# Patient Record
Sex: Male | Born: 1980 | Race: White | Hispanic: No | Marital: Single | Smoking: Never smoker
Health system: Southern US, Community
[De-identification: ages and names within clinical notes are randomized; demographics above are authoritative.]

## PROBLEM LIST (undated history)

## (undated) DIAGNOSIS — M549 Dorsalgia, unspecified: Secondary | ICD-10-CM

## (undated) HISTORY — PX: HIP SURGERY: SHX245

---

## 2011-11-15 ENCOUNTER — Emergency Department (HOSPITAL_COMMUNITY)
Admission: EM | Admit: 2011-11-15 | Discharge: 2011-11-15 | Disposition: A | Discharge status: Home / Self Care | Payer: Self-pay | Attending: Emergency Medicine | Admitting: Emergency Medicine

## 2011-11-15 ENCOUNTER — Emergency Department (HOSPITAL_COMMUNITY): Payer: Self-pay

## 2011-11-15 ENCOUNTER — Encounter (HOSPITAL_COMMUNITY): Payer: Self-pay | Admitting: Emergency Medicine

## 2011-11-15 DIAGNOSIS — M25579 Pain in unspecified ankle and joints of unspecified foot: Secondary | ICD-10-CM | POA: Insufficient documentation

## 2011-11-15 MED ORDER — NAPROXEN 500 MG PO TABS: 500.0000 mg | ORAL_TABLET | Freq: Two times a day (BID) | ORAL | Status: AC

## 2011-11-15 NOTE — ED Notes (Signed)
Patient transported to X-ray 

## 2011-11-15 NOTE — ED Notes (Signed)
Pt refused crutches, stated that he had crutches at home

## 2011-11-15 NOTE — ED Notes (Signed)
Pt states he woke up with swelling and redness to right ankle this morning, denies any injury, possible an insect bite, warm to touch and tender to touch, painful to ambulate on it; pt also c/o left lower back pain x 1 week, pt thinks he may have twisted his back

## 2011-11-15 NOTE — ED Notes (Signed)
MD at bedside. 

## 2011-11-15 NOTE — ED Notes (Signed)
Swelling to left ankle and hurting in back. Denies injury.

## 2011-11-15 NOTE — ED Provider Notes (Cosign Needed)
History  This chart was scribed for Benny Lennert, MD by Bennett Scrape. This patient was seen in room APFT22/APFT22 and the patient's care was started at 1:41PM.  CSN: [id number]  Arrival date & time 11/15/11  1324   First MD Initiated Contact with Patient 11/15/11 1341      Chief Complaint  Patient presents with  . Ankle Pain    Patient is a 31 y.o. male presenting with ankle pain. The history is provided by the patient. No language interpreter was used.  Ankle Pain  The incident occurred 6 to 12 hours ago. The incident occurred at home. There was no injury mechanism. The pain is present in the right ankle. The pain has been constant since onset. He reports no foreign bodies present. The symptoms are aggravated by bearing weight. He has tried nothing for the symptoms.    Francisco Kim is a 31 y.o. male who presents to the Emergency Department complaining of gradual onset, non-changing, constant right ankle pain described as throbbing that he woke up with today. He denies having any recent falls, injuries or insect bites and states that the pain is worse with bearing weight. He reports taking benadryl with no improvement. He denies any other injuries or symptoms including fever, sore throat, nausea and back pain as associated symptoms. He has a family h/o gout but denies prior episodes of gout. He does not have a h/o chronic medical conditions. He is an occasional alcohol user but denies smoking.  History reviewed. No pertinent past medical history.  History reviewed. No pertinent past surgical history.  History reviewed. No pertinent family history.  History  Substance Use Topics  . Smoking status: Never Smoker   . Smokeless tobacco: Not on file  . Alcohol Use: Yes     occ   Pt works as a Engineer, petroleum.   Review of Systems  Constitutional: Negative for fatigue.  HENT: Negative for congestion, sinus pressure and ear discharge.   Eyes: Negative for discharge.    Respiratory: Negative for cough.   Cardiovascular: Negative for chest pain.  Gastrointestinal: Negative for abdominal pain and diarrhea.  Genitourinary: Negative for frequency and hematuria.  Musculoskeletal: Negative for back pain.       Right ankle pain  Skin: Negative for rash.  Neurological: Negative for seizures and headaches.  Hematological: Negative.   Psychiatric/Behavioral: Negative for hallucinations.    Allergies  Review of patient's allergies indicates no known allergies.  Home Medications  No current outpatient prescriptions on file.  Triage Vitals: BP 125/86  Pulse 102  Temp 98.4 F (36.9 C) (Oral)  Resp 18  Ht 5\' 6"  (1.676 m)  Wt 135 lb (61.236 kg)  BMI 21.79 kg/m2  SpO2 100%  Physical Exam  Nursing note and vitals reviewed. Constitutional: He is oriented to person, place, and time. He appears well-developed and well-nourished.  HENT:  Head: Normocephalic and atraumatic.  Eyes: Conjunctivae are normal.  Neck: Neck supple. No tracheal deviation present.  Cardiovascular: Normal rate.   Pulmonary/Chest: Effort normal. No respiratory distress.  Musculoskeletal: Normal range of motion. He exhibits edema and tenderness.       Swelling and tenderness to the lateral right ankle  Neurological: He is alert and oriented to person, place, and time.  Skin: Skin is warm and dry.  Psychiatric: He has a normal mood and affect. His behavior is normal.    ED Course  Procedures (including critical care time)  DIAGNOSTIC STUDIES: Oxygen Saturation is 100% on  room air, normal by my interpretation.    COORDINATION OF CARE: 1:48PM-Discussed treatment plan which includes an x-ray of the right ankle with pt at bedside and pt agreed to plan. 2:10PM-All x-rays reviewed with patient and discharge plan discussed which includes pain medication and work note. Pt now admits that the symptoms could be from an insect bite.   Labs Reviewed - No data to display Dg Ankle Complete  Right  11/15/2011  *RADIOLOGY REPORT*  Clinical Data: Right ankle pain.  No known injury.  RIGHT ANKLE - COMPLETE 3+ VIEW  Comparison: None.  Findings: Diffuse soft tissue swelling, most pronounced laterally. No definite effusion.  Unfused ossification center distal to the medial malleolus.  Tiny inferior calcaneal spur.  IMPRESSION: Soft tissue swelling without acute underlying bony abnormality.  Original Report Authenticated By: Darrol Angel, M.D.     No diagnosis found.    MDM        The chart was scribed for me under my direct supervision.  I personally performed the history, physical, and medical decision making and all procedures in the evaluation of this patient.Benny Lennert, MD 11/15/11 1414

## 2013-08-26 IMAGING — CR DG ANKLE COMPLETE 3+V*R*
3 series · 3 of 3 positions shown · non-contrast
Comparison: None.

CLINICAL DATA: Right ankle pain.  No known injury.

RIGHT ANKLE - COMPLETE 3+ VIEW

[view not recorded (1 of 3)]
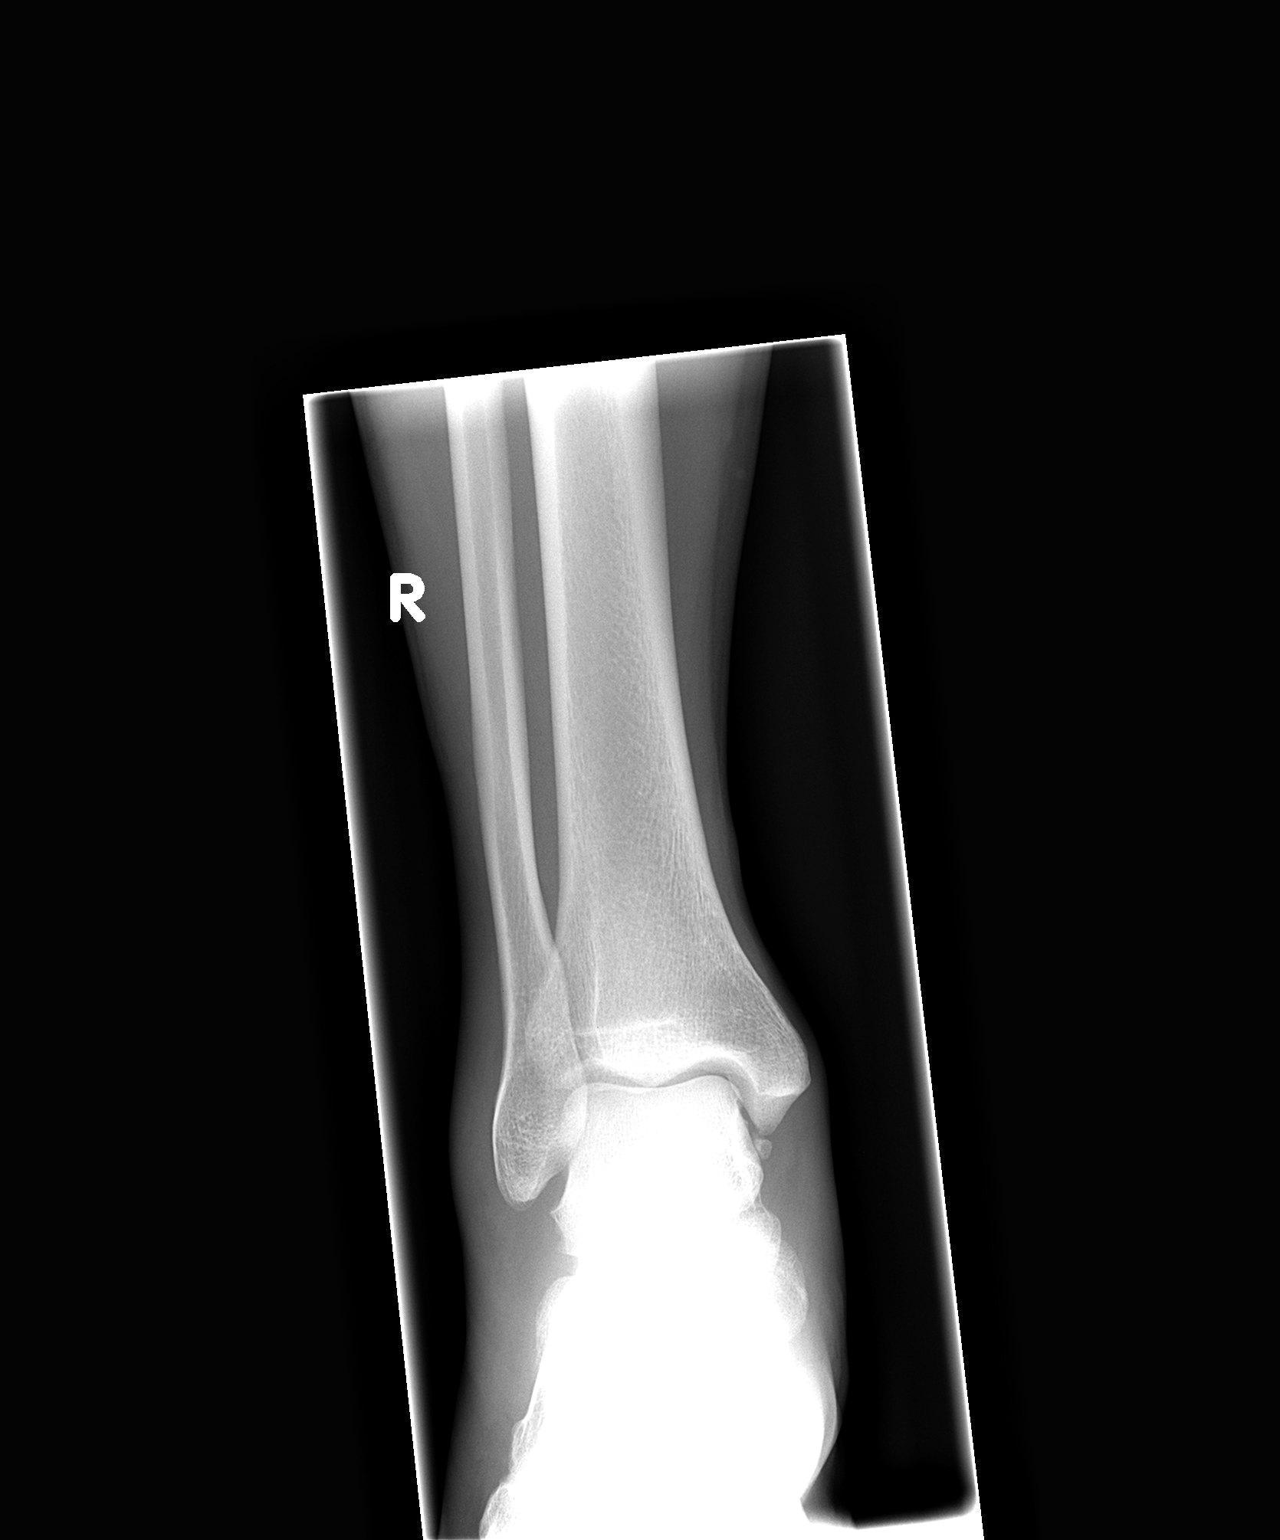

[view not recorded (2 of 3)]
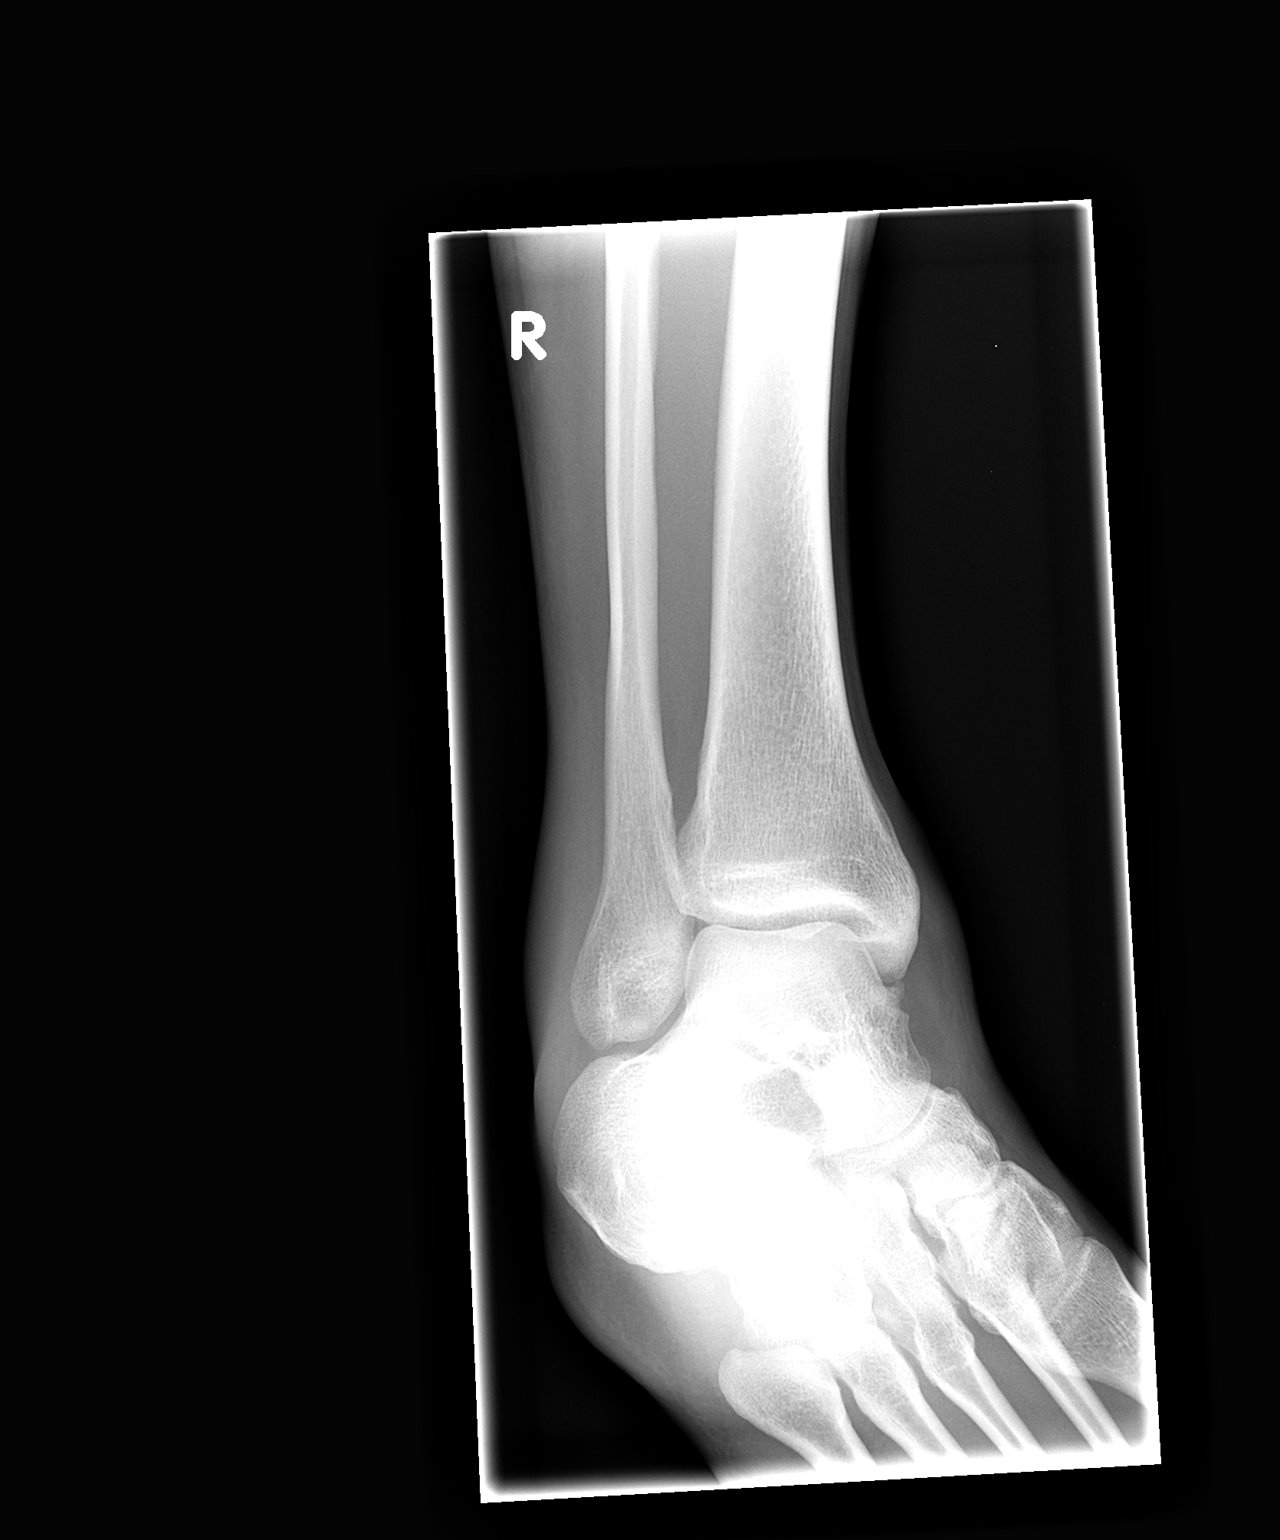

[view not recorded (3 of 3)]
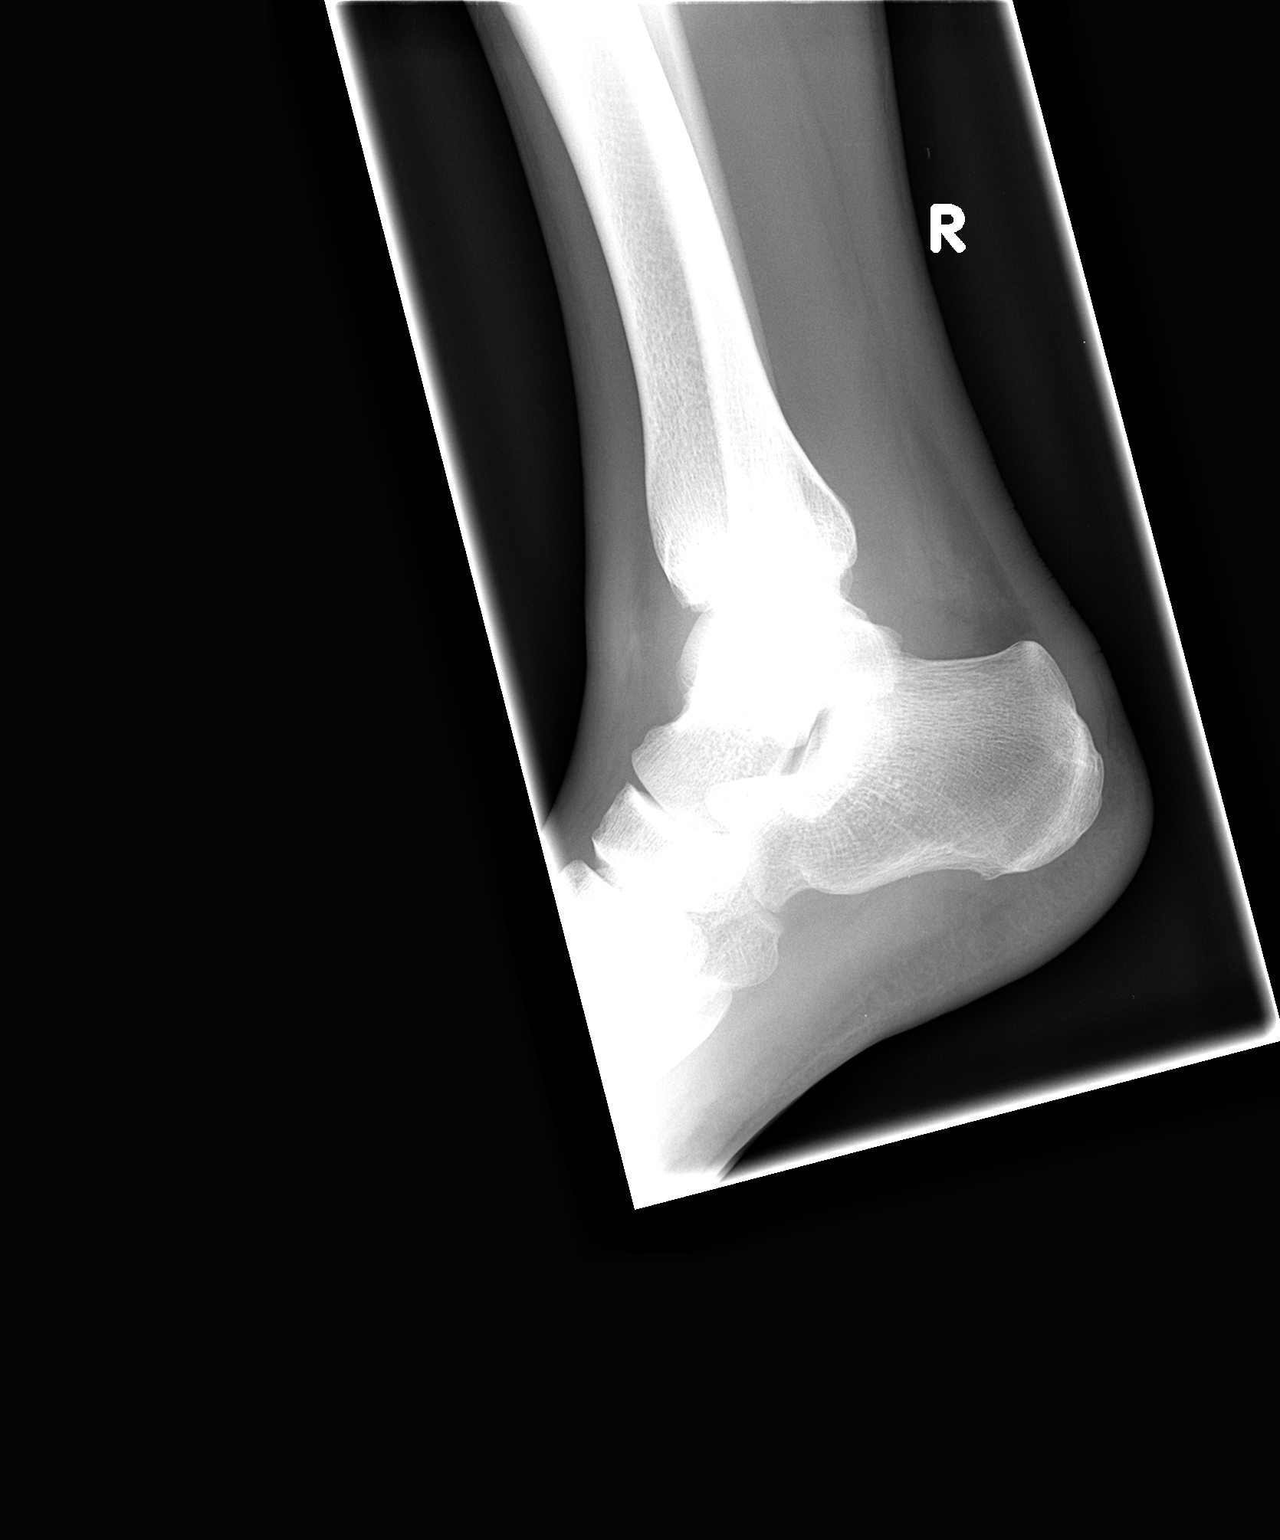

[3 of 3 positions shown; findings below may reference images not displayed]

FINDINGS: Diffuse soft tissue swelling, most pronounced laterally.
No definite effusion.  Unfused ossification center distal to the
medial malleolus.  Tiny inferior calcaneal spur.
IMPRESSION: Soft tissue swelling without acute underlying bony abnormality.

## 2019-10-30 ENCOUNTER — Emergency Department (HOSPITAL_COMMUNITY)
Admission: EM | Admit: 2019-10-30 | Discharge: 2019-10-30 | Disposition: A | Discharge status: Home / Self Care | Payer: Self-pay | Attending: Emergency Medicine | Admitting: Emergency Medicine

## 2019-10-30 ENCOUNTER — Encounter (HOSPITAL_COMMUNITY): Payer: Self-pay | Admitting: Emergency Medicine

## 2019-10-30 ENCOUNTER — Other Ambulatory Visit: Payer: Self-pay

## 2019-10-30 DIAGNOSIS — M5442 Lumbago with sciatica, left side: Secondary | ICD-10-CM | POA: Insufficient documentation

## 2019-10-30 DIAGNOSIS — M5432 Sciatica, left side: Secondary | ICD-10-CM

## 2019-10-30 DIAGNOSIS — Z96649 Presence of unspecified artificial hip joint: Secondary | ICD-10-CM | POA: Insufficient documentation

## 2019-10-30 HISTORY — DX: Dorsalgia, unspecified: M54.9

## 2019-10-30 MED ORDER — PREDNISONE 20 MG PO TABS: 40.0000 mg | ORAL_TABLET | Freq: Every day | ORAL | 0 refills | Status: AC

## 2019-10-30 MED ORDER — MELOXICAM 7.5 MG PO TABS: 7.5000 mg | ORAL_TABLET | Freq: Two times a day (BID) | ORAL | 0 refills | Status: AC | PRN

## 2019-10-30 NOTE — ED Notes (Signed)
Pt ambulatory to room number 23, pt c/o back pain, states that he has been seeing his chiropractor without relief.  Denies numbness or tingling in his lower extremities.

## 2019-10-30 NOTE — Discharge Instructions (Signed)
Prednisone daily for 5 days, Mobic twice a day as needed, take an antacid with this to make sure you do not get stomach ulcers or bad acid reflux.  Emergency department for severe worsening pain numbness or weakness

## 2019-10-30 NOTE — ED Triage Notes (Signed)
Pt c/o of back x2 wks, has been going to the chiropractor without relief.

## 2019-10-30 NOTE — ED Provider Notes (Signed)
[facility] EMERGENCY DEPARTMENT Provider Note   CSN: [id number] Arrival date & time: 10/30/19  1108     History Chief Complaint  Patient presents with   Back Pain    Francisco Kim is a 39 y.o. male.  HPI   Patient is a 39 year old male, he has no significant prior history especially no prior history of back surgery.  He presents with back pain that started ago when he was trying to lift a heavy cedar chest.  He reports that the pain was acute, it is left lower back, it radiates down the left leg posteriorly to the knee.  He denies numbness or weakness, denies cancer, fevers, chills, focal neurologic deficits and denies a history of cancer or IV drug use.  This patient has never had back surgery.  He has been taking anti-inflammatories without significant relief.  He is using topical products as well.  He had seen a chiropractor who told him he might have a pinched nerve.  Past Medical History:  Diagnosis Date   Back pain     There are no problems to display for this patient.   Past Surgical History:  Procedure Laterality Date   HIP SURGERY         No family history on file.  Social History   Tobacco Use   Smoking status: Never Smoker   Smokeless tobacco: Never Used  Vaping Use   Vaping Use: Never used  Substance Use Topics   Alcohol use: Yes    Comment: occ   Drug use: No    Home Medications Prior to Admission medications   Medication Sig Start Date End Date Taking? Authorizing Provider  meloxicam (MOBIC) 7.5 MG tablet Take 1 tablet (7.5 mg total) by mouth 2 (two) times daily as needed for up to 14 days for pain. 10/30/19 11/13/19  Eber Hong, MD  predniSONE (DELTASONE) 20 MG tablet Take 2 tablets (40 mg total) by mouth daily. 10/30/19   Eber Hong, MD    Allergies    Patient has no known allergies.  Review of Systems   Review of Systems  Constitutional: Negative for fever.  Musculoskeletal: Positive for back pain.  Neurological:  Negative for numbness.    Physical Exam Updated Vital Signs BP (!) 140/107 (BP Location: Right Arm)    Pulse 70    Temp 98 F (36.7 C) (Oral)    Resp 16    Ht 1.651 m (5\' 5" )    Wt 68 kg    SpO2 100%    BMI 24.96 kg/m   Physical Exam Constitutional:      General: He is not in acute distress.    Appearance: He is well-developed. He is not diaphoretic.  HENT:     Head: Normocephalic and atraumatic.  Eyes:     General: No scleral icterus.       Right eye: No discharge.        Left eye: No discharge.     Conjunctiva/sclera: Conjunctivae normal.  Cardiovascular:     Rate and Rhythm: Normal rate and regular rhythm.  Pulmonary:     Effort: Pulmonary effort is normal.     Breath sounds: Normal breath sounds.  Musculoskeletal:        General: Tenderness present.     Comments: Tenderness of the back over the left lower back, no spinal tenderness No tenderness over the Cervical, Thoracic or Lumbar Spine  Skin:    General: Skin is warm and dry.  Findings: No rash.  Neurological:     Comments: Speech is clear, strength in the UE and LE's are normal at the major muscle groups including the hip, knee and ankles.  Sensation in tact to light touch and pin prick of the bilateral LE's.  Normal reflexes at the knees bilaterally.  Gait antalgic secondary to pain.     ED Results / Procedures / Treatments   Labs (all labs ordered are listed, but only abnormal results are displayed) Labs Reviewed - No data to display  EKG None  Radiology No results found.  Procedures Procedures (including critical care time)  Medications Ordered in ED Medications - No data to display  ED Course  I have reviewed the triage vital signs and the nursing notes.  Pertinent labs & imaging results that were available during my care of the patient were reviewed by me and considered in my medical decision making (see chart for details).    MDM Rules/Calculators/A&P                          This  patient is well-appearing, he has back pain which may be somewhat radicular but mostly musculoskeletal, no deficits that would require advanced imaging or surgical referral immediately.  He will be given follow-up information as well as medication sent to his pharmacy, he is agreeable.  Final Clinical Impression(s) / ED Diagnoses Final diagnoses:  Sciatica, left side    Rx / DC Orders ED Discharge Orders         Ordered    predniSONE (DELTASONE) 20 MG tablet  Daily     Discontinue  Reprint     10/30/19 1154    meloxicam (MOBIC) 7.5 MG tablet  2 times daily PRN     Discontinue  Reprint     10/30/19 1154           Eber Hong, MD 10/30/19 1156

## 2022-12-22 ENCOUNTER — Emergency Department (HOSPITAL_COMMUNITY)
Admission: EM | Admit: 2022-12-22 | Discharge: 2022-12-22 | Disposition: A | Discharge status: Home / Self Care | Payer: Self-pay | Attending: Emergency Medicine | Admitting: Emergency Medicine

## 2022-12-22 ENCOUNTER — Encounter (HOSPITAL_COMMUNITY): Payer: Self-pay | Admitting: Emergency Medicine

## 2022-12-22 ENCOUNTER — Other Ambulatory Visit: Payer: Self-pay

## 2022-12-22 DIAGNOSIS — L03116 Cellulitis of left lower limb: Secondary | ICD-10-CM | POA: Insufficient documentation

## 2022-12-22 DIAGNOSIS — W57XXXA Bitten or stung by nonvenomous insect and other nonvenomous arthropods, initial encounter: Secondary | ICD-10-CM | POA: Insufficient documentation

## 2022-12-22 MED ORDER — DOXYCYCLINE HYCLATE 100 MG PO CAPS: 100.0000 mg | ORAL_CAPSULE | Freq: Two times a day (BID) | ORAL | 0 refills | Status: AC

## 2022-12-22 NOTE — ED Triage Notes (Signed)
Pt c/o possible insect bit to left lower leg. States started swelling with redness 2 days ago and spreading since. Most of medial calf area of left lower leg red. Swelling noted with black circular area in middle. Pain only with ambulating.

## 2022-12-22 NOTE — ED Provider Notes (Signed)
[place] EMERGENCY DEPARTMENT AT [facility] Provider Note   CSN: [id number] Arrival date & time: 12/22/22  8469     History  Chief Complaint  Patient presents with   Insect Bite    Francisco Kim is a 42 y.o. male.  HPI   42 year old male presents with a complaint of left lower extremity pain over his left medial calf, this is been going on for several days after seemingly having a puncture type wound to the medial calf.  He now has some spreading redness but no fevers or chills, no nausea or vomiting, he is not allergic to any antibiotics.  Home Medications Prior to Admission medications   Medication Sig Start Date End Date Taking? Authorizing Provider  doxycycline (VIBRAMYCIN) 100 MG capsule Take 1 capsule (100 mg total) by mouth 2 (two) times daily. 12/22/22  Yes Eber Hong, MD  predniSONE (DELTASONE) 20 MG tablet Take 2 tablets (40 mg total) by mouth daily. 10/30/19   Eber Hong, MD      Allergies    Beeswax    Review of Systems   Review of Systems  Constitutional:  Negative for fever.  Skin:  Positive for rash.    Physical Exam Updated Vital Signs BP (!) 139/106 (BP Location: Left Arm)   Pulse 77   Temp 98.7 F (37.1 C) (Oral)   Resp 18   SpO2 97%  Physical Exam Vitals and nursing note reviewed.  Constitutional:      Appearance: He is well-developed. He is not diaphoretic.  HENT:     Head: Normocephalic and atraumatic.  Eyes:     General:        Right eye: No discharge.        Left eye: No discharge.     Conjunctiva/sclera: Conjunctivae normal.  Pulmonary:     Effort: Pulmonary effort is normal. No respiratory distress.  Musculoskeletal:     Right lower leg: No edema.     Left lower leg: No edema.  Skin:    General: Skin is warm and dry.     Findings: Erythema present. No rash.     Comments: The left lower extremity was examined and there is a medial calf area of induration approximately 2 cm in diameter without fluctuance, there  is approximately 7 cm of surrounding erythema, it is not circumferential and there is normal pulses at the feet  Neurological:     Mental Status: He is alert.     Coordination: Coordination normal.     ED Results / Procedures / Treatments   Labs (all labs ordered are listed, but only abnormal results are displayed) Labs Reviewed - No data to display  EKG None  Radiology No results found.  Procedures Procedures    Medications Ordered in ED Medications - No data to display  ED Course/ Medical Decision Making/ A&P                                 Medical Decision Making  Patient's exam is unremarkable except for what appears to be an area of cellulitis to the left lower extremity, there is a central area of small induration but nothing that needs to have an incision and drainage at this time.  He was given appropriate return precautions, vitals are unremarkable except for mild hypertension.  He can follow-up in the outpatient setting or come back if things get worse, he expressed his  understanding to these instructions.  Will treat with doxycycline        Final Clinical Impression(s) / ED Diagnoses Final diagnoses:  Cellulitis of left lower extremity    Rx / DC Orders ED Discharge Orders          Ordered    doxycycline (VIBRAMYCIN) 100 MG capsule  2 times daily        12/22/22 0858              Eber Hong, MD 12/22/22 [phone]

## 2022-12-22 NOTE — Discharge Instructions (Signed)
Thank you for allowing [place] to treat you in the emergency department today.  After reviewing your examination and potential testing that was done it appears that you are safe to go home.  I would like for you to follow-up with your doctor within the next several days, have them obtain your results and follow-up with them to review all of these tests.  If you should develop severe or worsening symptoms return to the emergency department immediately  Doxycycline is an antibiotic which is taken twice a day, this treats bacterial infections that can cause staph infections, it treats sinus infections and some pneumonia.  In this case I would like for you to take the antibiotic exactly as prescribed until it is completed.  Please be aware that occasionally people will get a rash if they are in the sunlight for extended periods of time while taking this medicine.

## 2022-12-29 ENCOUNTER — Other Ambulatory Visit: Payer: Self-pay

## 2022-12-29 ENCOUNTER — Emergency Department (HOSPITAL_COMMUNITY)
Admission: EM | Admit: 2022-12-29 | Discharge: 2022-12-30 | Disposition: A | Discharge status: Home / Self Care | Payer: Self-pay | Attending: Emergency Medicine | Admitting: Emergency Medicine

## 2022-12-29 ENCOUNTER — Encounter (HOSPITAL_COMMUNITY): Payer: Self-pay

## 2022-12-29 DIAGNOSIS — L02416 Cutaneous abscess of left lower limb: Secondary | ICD-10-CM | POA: Insufficient documentation

## 2022-12-29 DIAGNOSIS — L02419 Cutaneous abscess of limb, unspecified: Secondary | ICD-10-CM

## 2022-12-29 NOTE — ED Triage Notes (Signed)
Spider bite 1 week ago Seen and treated in ED 9/5 Per pt, area is not bigger, swelling has not gone away  Denies fevers Bite to LLE

## 2022-12-30 MED ORDER — CLINDAMYCIN HCL 300 MG PO CAPS: 300.0000 mg | ORAL_CAPSULE | Freq: Four times a day (QID) | ORAL | 0 refills | Status: AC

## 2022-12-30 MED ORDER — CLINDAMYCIN HCL 150 MG PO CAPS
300.0000 mg | ORAL_CAPSULE | Freq: Once | ORAL | Status: AC
  Administered 2022-12-30: 300 mg via ORAL
  Filled 2022-12-30: qty 2

## 2022-12-30 NOTE — ED Provider Notes (Signed)
  [place] EMERGENCY DEPARTMENT AT [facility] Provider Note   CSN: [id number] Arrival date & time: 12/29/22  2236     History  Chief Complaint  Patient presents with   Insect Bite    Francisco Kim is a 42 y.o. male.  Patient is a 42 year old male presenting with complaints of right leg pain and swelling.  He believes he was bitten by a spider.  He was seen here 5 days ago and was given doxycycline.  The redness improved, but now there is swelling and pain located centrally.  No fevers or chills.  The history is provided by the patient.       Home Medications Prior to Admission medications   Medication Sig Start Date End Date Taking? Authorizing Provider  doxycycline (VIBRAMYCIN) 100 MG capsule Take 1 capsule (100 mg total) by mouth 2 (two) times daily. 12/22/22   Eber Hong, MD  predniSONE (DELTASONE) 20 MG tablet Take 2 tablets (40 mg total) by mouth daily. 10/30/19   Eber Hong, MD      Allergies    Bee venom    Review of Systems   Review of Systems  All other systems reviewed and are negative.   Physical Exam Updated Vital Signs BP 114/84   Pulse 66   Temp 98 F (36.7 C) (Oral)   Resp 20   Ht 5\' 3"  (1.6 m)   Wt 70.3 kg   SpO2 98%   BMI 27.46 kg/m  Physical Exam Vitals and nursing note reviewed.  Constitutional:      Appearance: Normal appearance.  Pulmonary:     Effort: Pulmonary effort is normal.  Skin:    General: Skin is warm and dry.     Comments: To the medial aspect of the left calf there is a 3 cm round area of fluctuance and erythema that is tender to palpation.  Neurological:     Mental Status: He is alert and oriented to person, place, and time.     ED Results / Procedures / Treatments   Labs (all labs ordered are listed, but only abnormal results are displayed) Labs Reviewed - No data to display  EKG None  Radiology No results found.  Procedures Procedures   INCISION AND DRAINAGE Performed by: Geoffery Lyons Consent: Verbal consent obtained. Risks and benefits: risks, benefits and alternatives were discussed Type: abscess  Body area: Left calf  Anesthesia: local infiltration  Incision was made with a scalpel.  Local anesthetic: lidocaine none used  Anesthetic total: None  Complexity: complex Blunt dissection to break up loculations  Drainage: purulent  Drainage amount: Moderate  Packing material: No packing placed  Patient tolerance: Patient tolerated the procedure well with no immediate complications.    Medications Ordered in ED Medications - No data to display  ED Course/ Medical Decision Making/ A&P  Patient presenting with an area of abscess noted to the left inner calf.  This was incised and drained as above.  Purulent material was expressed along with a more solid core.  Patient tolerated the procedure well.  I will switch his antibiotics from doxycycline to clindamycin and see if this helps.  To return as needed for any problems.  Final Clinical Impression(s) / ED Diagnoses Final diagnoses:  None    Rx / DC Orders ED Discharge Orders     None         Geoffery Lyons, MD 12/30/22 [id number]

## 2022-12-30 NOTE — Discharge Instructions (Addendum)
Begin taking clindamycin as prescribed.  Stop taking doxycycline.  Apply warm compresses or soaks as frequently as possible for the next several days.
